# Patient Record
Sex: Male | Born: 1990 | Race: White | Hispanic: No | Marital: Married | Smoking: Never smoker
Health system: Southern US, Community
[De-identification: ages and names within clinical notes are randomized; demographics above are authoritative.]

## PROBLEM LIST (undated history)

## (undated) DIAGNOSIS — Z973 Presence of spectacles and contact lenses: Secondary | ICD-10-CM

## (undated) DIAGNOSIS — M6289 Other specified disorders of muscle: Secondary | ICD-10-CM

## (undated) HISTORY — PX: VASECTOMY: SHX75

## (undated) HISTORY — DX: Other specified disorders of muscle: M62.89

## (undated) HISTORY — DX: Presence of spectacles and contact lenses: Z97.3

---

## 2015-03-22 HISTORY — PX: VASECTOMY: SHX75

## 2016-03-06 ENCOUNTER — Encounter (HOSPITAL_COMMUNITY): Payer: Self-pay

## 2016-03-06 ENCOUNTER — Emergency Department (HOSPITAL_COMMUNITY)
Admission: EM | Admit: 2016-03-06 | Discharge: 2016-03-06 | Disposition: A | Discharge status: Home / Self Care | Payer: Managed Care, Other (non HMO) | Attending: Emergency Medicine | Admitting: Emergency Medicine

## 2016-03-06 ENCOUNTER — Emergency Department (HOSPITAL_COMMUNITY): Payer: Managed Care, Other (non HMO)

## 2016-03-06 DIAGNOSIS — J189 Pneumonia, unspecified organism: Secondary | ICD-10-CM | POA: Insufficient documentation

## 2016-03-06 DIAGNOSIS — R05 Cough: Secondary | ICD-10-CM | POA: Diagnosis present

## 2016-03-06 DIAGNOSIS — J9801 Acute bronchospasm: Secondary | ICD-10-CM | POA: Diagnosis not present

## 2016-03-06 MED ORDER — PROAIR HFA 108 (90 BASE) MCG/ACT IN AERS: 2.0000 | INHALATION_SPRAY | Freq: Three times a day (TID) | RESPIRATORY_TRACT | 0 refills | Status: DC | PRN

## 2016-03-06 MED ORDER — PREDNISONE 10 MG (21) PO TBPK: 10.0000 mg | ORAL_TABLET | Freq: Every day | ORAL | 0 refills | Status: DC

## 2016-03-06 MED ORDER — IPRATROPIUM BROMIDE 0.02 % IN SOLN
0.5000 mg | Freq: Once | RESPIRATORY_TRACT | Status: AC
  Administered 2016-03-06: 0.5 mg via RESPIRATORY_TRACT
  Filled 2016-03-06: qty 2.5

## 2016-03-06 MED ORDER — ALBUTEROL SULFATE (2.5 MG/3ML) 0.083% IN NEBU
5.0000 mg | INHALATION_SOLUTION | Freq: Once | RESPIRATORY_TRACT | Status: AC
  Administered 2016-03-06: 5 mg via RESPIRATORY_TRACT
  Filled 2016-03-06: qty 6

## 2016-03-06 MED ORDER — IPRATROPIUM-ALBUTEROL 0.5-2.5 (3) MG/3ML IN SOLN
3.0000 mL | Freq: Once | RESPIRATORY_TRACT | Status: AC
  Administered 2016-03-06: 3 mL via RESPIRATORY_TRACT
  Filled 2016-03-06: qty 3

## 2016-03-06 MED ORDER — PREDNISONE 20 MG PO TABS
60.0000 mg | ORAL_TABLET | Freq: Once | ORAL | Status: AC
  Administered 2016-03-06: 60 mg via ORAL
  Filled 2016-03-06: qty 3

## 2016-03-06 MED ORDER — AZITHROMYCIN 250 MG PO TABS: 250.0000 mg | ORAL_TABLET | Freq: Every day | ORAL | 0 refills | Status: DC

## 2016-03-06 MED ORDER — AZITHROMYCIN 250 MG PO TABS
500.0000 mg | ORAL_TABLET | Freq: Once | ORAL | Status: AC
  Administered 2016-03-06: 500 mg via ORAL
  Filled 2016-03-06: qty 2

## 2016-03-06 MED ORDER — HYDROCOD POLST-CPM POLST ER 10-8 MG/5ML PO SUER: 5.0000 mL | Freq: Every evening | ORAL | 0 refills | Status: DC | PRN

## 2016-03-06 NOTE — Discharge Instructions (Signed)
Read the information below.  Use the prescribed medication as directed.  Please discuss all new medications with your pharmacist.  You may return to the Emergency Department at any time for worsening condition or any new symptoms that concern you.   If you develop worsening shortness of breath, uncontrolled wheezing, severe chest pain, or fevers despite using tylenol and/or ibuprofen, return for a recheck.     °

## 2016-03-06 NOTE — ED Notes (Signed)
Patient transported to X-ray 

## 2016-03-06 NOTE — ED Notes (Signed)
Respiratory notified of nebulizations. 

## 2016-03-06 NOTE — ED Provider Notes (Signed)
[facility]-EMERGENCY DEPT Provider Note   CSN: [id number] Arrival date & time: 03/06/16  1039     History   Chief Complaint Chief Complaint  Patient presents with  . Cough    HPI Nicolas Doyle is a 25 y.o. male.  HPI   Patient presents with cough, wheezing, fever that began 4 days ago.  Fever to 102.7 last night.  Has developed mild sore throat, headache, and chest soreness from coughing.  Associated SOB.  Initially was coughing up brown sputum, then transitioned to clear.  Has been using inhaler and cough syrup given to him 3 days ago by PCP without improvement.  Also taking aleve for associated pain.  Denies leg swelling, recent immobilization, personal or family hx blood clots.  Pt notes he is very nervous because he hasn't been to a hospital since he was a baby.  Was sent in by [facility] because they do not have xrays at their facility.    History reviewed. No pertinent past medical history.  There are no active problems to display for this patient.   Past Surgical History:  Procedure Laterality Date  . VASECTOMY         Home Medications    Prior to Admission medications   Medication Sig Start Date End Date Taking? Authorizing Provider  brompheniramine-pseudoephedrine-DM 30-2-10 MG/5ML syrup Take 5 mLs by mouth 3 (three) times daily as needed for cough. 03/02/16  Yes Historical Provider, MD  naproxen sodium (ANAPROX) 220 MG tablet Take 220-440 mg by mouth 2 (two) times daily with a meal.   Yes Historical Provider, MD  oseltamivir (TAMIFLU) 75 MG capsule Take 75 mg by mouth 2 (two) times daily. 03/02/16  Yes Historical Provider, MD  azithromycin (ZITHROMAX) 250 MG tablet Take 1 tablet (250 mg total) by mouth daily. Start 03/07/16 03/06/16   Trixie Dredge, PA-C  chlorpheniramine-HYDROcodone (TUSSIONEX PENNKINETIC ER) 10-8 MG/5ML SUER Take 5 mLs by mouth at bedtime as needed for cough. 03/06/16   Trixie Dredge, PA-C  predniSONE (STERAPRED UNI-PAK 21 TAB) 10 MG (21) TBPK  tablet Take 1 tablet (10 mg total) by mouth daily. Day 1: take 6 tabs.  Day 2: 5 tabs  Day 3: 4 tabs  Day 4: 3 tabs  Day 5: 2 tabs  Day 6: 1 tab 03/06/16   Trixie Dredge, PA-C  PROAIR HFA 108 [phone] Base) MCG/ACT inhaler Inhale 2 puffs into the lungs 3 (three) times daily as needed for shortness of breath. 03/06/16   Trixie Dredge, PA-C    Family History No family history on file.  Social History Social History  Substance Use Topics  . Smoking status: Never Smoker  . Smokeless tobacco: Never Used  . Alcohol use Yes     Comment: rare     Allergies   Patient has no known allergies.   Review of Systems Review of Systems  All other systems reviewed and are negative.    Physical Exam Updated Vital Signs BP 135/80 (BP Location: Right Arm)   Pulse 100   Temp 99.2 F (37.3 C) (Oral)   Resp 18   Ht 5\' 8"  (1.727 m)   Wt 112.9 kg   SpO2 96%   BMI 37.86 kg/m   Physical Exam  Constitutional: He appears well-developed and well-nourished. No distress.  HENT:  Head: Normocephalic and atraumatic.  Mouth/Throat: Oropharynx is clear and moist. No oropharyngeal exudate.  Eyes: Conjunctivae and EOM are normal. Right eye exhibits no discharge. Left eye exhibits no discharge.  Neck: Normal  range of motion. Neck supple.  Cardiovascular: Normal rate and regular rhythm.   Pulmonary/Chest: Effort normal. No stridor. No respiratory distress. He has wheezes. He has no rales.  Abdominal: Soft. He exhibits no distension and no mass. There is no tenderness. There is no guarding.  Musculoskeletal: He exhibits no edema or tenderness.  Lymphadenopathy:    He has no cervical adenopathy.  Neurological: He is alert.  Skin: He is not diaphoretic.  Psychiatric: His mood appears anxious.  Nursing note and vitals reviewed.    ED Treatments / Results  Labs (all labs ordered are listed, but only abnormal results are displayed) Labs Reviewed - No data to display  EKG  EKG Interpretation None        Radiology Dg Chest 2 View  Result Date: 03/06/2016 CLINICAL DATA:  Productive cough. EXAM: CHEST  2 VIEW COMPARISON:  None. FINDINGS: Multifocal pneumonia involving the left upper lobe and bilateral lower lobes. No other acute abnormalities. IMPRESSION: Multifocal pneumonia.  Recommend follow-up to resolution. Electronically Signed   By: Gerome Samavid  Williams III M.D   On: 03/06/2016 11:49    Procedures Procedures (including critical care time)  Medications Ordered in ED Medications  ipratropium-albuterol (DUONEB) 0.5-2.5 (3) MG/3ML nebulizer solution 3 mL (3 mLs Nebulization Given 03/06/16 1125)  predniSONE (DELTASONE) tablet 60 mg (60 mg Oral Given 03/06/16 1118)  azithromycin (ZITHROMAX) tablet 500 mg (500 mg Oral Given 03/06/16 1210)  albuterol (PROVENTIL) (2.5 MG/3ML) 0.083% nebulizer solution 5 mg (5 mg Nebulization Given 03/06/16 1237)  ipratropium (ATROVENT) nebulizer solution 0.5 mg (0.5 mg Nebulization Given 03/06/16 1237)     Initial Impression / Assessment and Plan / ED Course  I have reviewed the triage vital signs and the nursing notes.  Pertinent labs & imaging results that were available during my care of the patient were reviewed by me and considered in my medical decision making (see chart for details).  Clinical Course as of Mar 07 1839  Wynelle LinkSun Mar 06, 2016  1227 Persistent loud wheezing throughout  [EW]  1429 Patient feeling much better.  Lungs overall very clear, few rales, occasional slight wheeze.  [EW]    Clinical Course User Index [EW] Trixie DredgeEmily Joyce Heitman, PA-C    Patient with 5 days of fever, cough, wheezing, SOB found to have multifocal pneumonia.  Diffuse wheezing on exam.  Nebs, prednisone, azithromycin given with great improvement.  D/C home with azithromycin, tussionex, prednisone.  Discussed return precautions. Discussed result, findings, treatment, and follow up  with patient.  Pt given return precautions.  Pt verbalizes understanding and agrees with plan.        Final Clinical Impressions(s) / ED Diagnoses   Final diagnoses:  Community acquired pneumonia, unspecified laterality  Bronchospasm    New Prescriptions Discharge Medication List as of 03/06/2016  2:35 PM    START taking these medications   Details  azithromycin (ZITHROMAX) 250 MG tablet Take 1 tablet (250 mg total) by mouth daily. Start 03/07/16, Starting Sun 03/06/2016, Print    chlorpheniramine-HYDROcodone (TUSSIONEX PENNKINETIC ER) 10-8 MG/5ML SUER Take 5 mLs by mouth at bedtime as needed for cough., Starting Sun 03/06/2016, Print    predniSONE (STERAPRED UNI-PAK 21 TAB) 10 MG (21) TBPK tablet Take 1 tablet (10 mg total) by mouth daily. Day 1: take 6 tabs.  Day 2: 5 tabs  Day 3: 4 tabs  Day 4: 3 tabs  Day 5: 2 tabs  Day 6: 1 tab, Starting Sun 03/06/2016, Print  Trixie Dredgemily Sharone Picchi, PA-C 03/06/16 1841    Shaune Pollackameron Isaacs, MD 03/07/16 [id number]

## 2016-03-06 NOTE — ED Triage Notes (Signed)
Pt presents with c/o productive cough and fever. Pt was sent her by [facility] for a chest x-ray. Pt reports a max temp at home 102.8, temp in triage is 99.1 with a HR of 117.

## 2016-03-06 NOTE — ED Notes (Signed)
Respiratory notified of nebulization. 

## 2016-03-06 NOTE — ED Notes (Signed)
ED Provider at bedside. 

## 2016-03-06 NOTE — ED Notes (Signed)
Patient given water per EDP request.

## 2016-03-06 NOTE — ED Notes (Signed)
Discharge instructions, follow up care, and rx x4 reviewed with patient. Patient verbalized understanding. 

## 2019-09-11 ENCOUNTER — Telehealth: Payer: Self-pay

## 2019-09-11 NOTE — Telephone Encounter (Signed)
Pt called back immediately and is scheduled for new pt appt on 10/09/19 @ 1 pm.

## 2019-09-11 NOTE — Telephone Encounter (Signed)
LMOVM for pt to call me back  

## 2019-10-08 DIAGNOSIS — M6289 Other specified disorders of muscle: Secondary | ICD-10-CM | POA: Insufficient documentation

## 2019-10-09 ENCOUNTER — Ambulatory Visit (HOSPITAL_BASED_OUTPATIENT_CLINIC_OR_DEPARTMENT_OTHER)
Admission: RE | Admit: 2019-10-09 | Discharge: 2019-10-09 | Disposition: A | Discharge status: Home / Self Care | Payer: 59 | Source: Ambulatory Visit | Attending: Internal Medicine | Admitting: Internal Medicine

## 2019-10-09 ENCOUNTER — Other Ambulatory Visit: Payer: Self-pay

## 2019-10-09 ENCOUNTER — Ambulatory Visit: Payer: Managed Care, Other (non HMO) | Admitting: Internal Medicine

## 2019-10-09 ENCOUNTER — Encounter: Payer: Self-pay | Admitting: Internal Medicine

## 2019-10-09 VITALS — BP 119/79 | HR 78 | Temp 98.7°F | Resp 18 | Ht 68.0 in | Wt 236.2 lb

## 2019-10-09 DIAGNOSIS — Z23 Encounter for immunization: Secondary | ICD-10-CM | POA: Diagnosis not present

## 2019-10-09 DIAGNOSIS — Z0000 Encounter for general adult medical examination without abnormal findings: Secondary | ICD-10-CM

## 2019-10-09 DIAGNOSIS — M25531 Pain in right wrist: Secondary | ICD-10-CM | POA: Insufficient documentation

## 2019-10-09 DIAGNOSIS — Z114 Encounter for screening for human immunodeficiency virus [HIV]: Secondary | ICD-10-CM | POA: Diagnosis not present

## 2019-10-09 DIAGNOSIS — Z1159 Encounter for screening for other viral diseases: Secondary | ICD-10-CM | POA: Diagnosis not present

## 2019-10-09 LAB — COMPREHENSIVE METABOLIC PANEL
ALT: 18 U/L (ref 0–53)
AST: 13 U/L (ref 0–37)
Albumin: 4.6 g/dL (ref 3.5–5.2)
Alkaline Phosphatase: 80 U/L (ref 39–117)
BUN: 9 mg/dL (ref 6–23)
CO2: 34 mEq/L — ABNORMAL HIGH (ref 19–32)
Calcium: 9.9 mg/dL (ref 8.4–10.5)
Chloride: 100 mEq/L (ref 96–112)
Creatinine, Ser: 0.73 mg/dL (ref 0.40–1.50)
GFR: 125.25 mL/min (ref >60.00)
Glucose, Bld: 90 mg/dL (ref 70–99)
Potassium: 4.5 mEq/L (ref 3.5–5.1)
Sodium: 140 mEq/L (ref 135–145)
Total Bilirubin: 0.4 mg/dL (ref 0.2–1.2)
Total Protein: 7.2 g/dL (ref 6.0–8.3)

## 2019-10-09 LAB — CBC WITH DIFFERENTIAL/PLATELET
Basophils Absolute: 0.1 10*3/uL (ref 0.0–0.1)
Basophils Relative: 1 % (ref 0.0–3.0)
Eosinophils Absolute: 0.1 10*3/uL (ref 0.0–0.7)
Eosinophils Relative: 2.1 % (ref 0.0–5.0)
HCT: 42.7 % (ref 39.0–52.0)
Hemoglobin: 14.6 g/dL (ref 13.0–17.0)
Lymphocytes Relative: 25 % (ref 12.0–46.0)
Lymphs Abs: 1.7 10*3/uL (ref 0.7–4.0)
MCHC: 34.3 g/dL (ref 30.0–36.0)
MCV: 89.9 fl (ref 78.0–100.0)
Monocytes Absolute: 0.5 10*3/uL (ref 0.1–1.0)
Monocytes Relative: 7.6 % (ref 3.0–12.0)
Neutro Abs: 4.3 10*3/uL (ref 1.4–7.7)
Neutrophils Relative %: 64.3 % (ref 43.0–77.0)
Platelets: 303 10*3/uL (ref 150.0–400.0)
RBC: 4.75 Mil/uL (ref 4.22–5.81)
RDW: 13.2 % (ref 11.5–15.5)
WBC: 6.8 10*3/uL (ref 4.0–10.5)

## 2019-10-09 LAB — LIPID PANEL
Cholesterol: 179 mg/dL (ref 0–200)
HDL: 51.3 mg/dL (ref >39.00)
LDL Cholesterol: 98 mg/dL (ref 0–99)
NonHDL: 127.9
Total CHOL/HDL Ratio: 3
Triglycerides: 151 mg/dL — ABNORMAL HIGH (ref 0.0–149.0)
VLDL: 30.2 mg/dL (ref 0.0–40.0)

## 2019-10-09 LAB — TSH: TSH: 1.4 u[IU]/mL (ref 0.35–4.50)

## 2019-10-09 NOTE — Progress Notes (Signed)
Subjective:    Patient ID: Nicolas Doyle, male    DOB: Jul 06, 1990, 29 y.o.   MRN: [id number]  DOS:  10/09/2019 Type of visit - description: CPX New patient, here to get established. Would like STD testing. Reported that he was ripping apart cardboard few weeks ago and in the process hurt his right wrist. Has diffuse wrist pain whenever he turns his hand -- like when he opens a doorknob. No swelling. Pain is already decreasing  Review of Systems Denies dysuria, gross hematuria, penile discharge.  No high risk sexual behaviors.   Other than above, a 14 point review of systems is negative      Past Medical History:  Diagnosis Date  . Pelvic floor dysfunction   . Wears glasses     Past Surgical History:  Procedure Laterality Date  . VASECTOMY  2017   Family History  Problem Relation Age of Onset  . Diabetes Maternal Grandfather   . Diabetes Paternal Grandmother   . Colon cancer Neg Hx   . Prostate cancer Neg Hx    Social History   Socioeconomic History  . Marital status: Married    Spouse name: Not on file  . Number of children: 0  . Years of education: Not on file  . Highest education level: Not on file  Occupational History  . Occupation: finished college  . Occupation: Sport and exercise psychologist   Tobacco Use  . Smoking status: Never Smoker  . Smokeless tobacco: Never Used  Substance and Sexual Activity  . Alcohol use: Yes    Comment: social  . Drug use: No  . Sexual activity: Not on file  Other Topics Concern  . Not on file  Social History Narrative  . Not on file   Social Determinants of Health   Financial Resource Strain:   . Difficulty of Paying Living Expenses:   Food Insecurity:   . Worried About Programme researcher, broadcasting/film/video in the Last Year:   . Barista in the Last Year:   Transportation Needs:   . Freight forwarder (Medical):   Marland Kitchen Lack of Transportation (Non-Medical):   Physical Activity:   . Days of Exercise per Week:   . Minutes  of Exercise per Session:   Stress:   . Feeling of Stress :   Social Connections:   . Frequency of Communication with Friends and Family:   . Frequency of Social Gatherings with Friends and Family:   . Attends Religious Services:   . Active Member of Clubs or Organizations:   . Attends Banker Meetings:   Marland Kitchen Marital Status:   Intimate Partner Violence:   . Fear of Current or Ex-Partner:   . Emotionally Abused:   Marland Kitchen Physically Abused:   . Sexually Abused:      Allergies as of 10/09/2019   No Known Allergies     Medication List       Accurate as of October 09, 2019 11:59 PM. If you have any questions, ask your nurse or doctor.        STOP taking these medications   azithromycin 250 MG tablet Commonly known as: ZITHROMAX Stopped by: Willow Ora, MD   brompheniramine-pseudoephedrine-DM 30-2-10 MG/5ML syrup Stopped by: Willow Ora, MD   chlorpheniramine-HYDROcodone 10-8 MG/5ML Suer Commonly known as: Tussionex Pennkinetic ER Stopped by: Willow Ora, MD   naproxen sodium 220 MG tablet Commonly known as: ALEVE Stopped by: Willow Ora, MD   oseltamivir 75 MG capsule Commonly known  as: TAMIFLU Stopped by: Willow Ora, MD   predniSONE 10 MG (21) Tbpk tablet Commonly known as: STERAPRED UNI-PAK 21 TAB Stopped by: Willow Ora, MD   ProAir HFA [phone] Base) MCG/ACT inhaler Generic drug: albuterol Stopped by: Willow Ora, MD     TAKE these medications   multivitamin with minerals tablet Take 1 tablet by mouth daily.          Objective:   Physical Exam BP 119/79 (BP Location: Left Arm, Patient Position: Sitting, Cuff Size: Normal)   Pulse 78   Temp 98.7 F (37.1 C) (Oral)   Resp 18   Ht 5\' 8"  (1.727 m)   Wt 236 lb 4 oz (107.2 kg)   SpO2 98%   BMI 35.92 kg/m  General: Well developed, NAD, BMI noted Neck: No  thyromegaly  HEENT:  Normocephalic . Face symmetric, atraumatic Lungs:  CTA B Normal respiratory effort, no intercostal retractions, no accessory muscle  use. Heart: RRR,  no murmur.  Abdomen:  Not distended, soft, non-tender. No rebound or rigidity.   Lower extremities: no pretibial edema bilaterally  Skin: Exposed areas without rash. Not pale. Not jaundice MSK: Left wrist normal Right wrist: No deformities, swelling, redness. Passive range of motion normal Active range of motion: Report some pain at the radial side Neurologic:  alert & oriented X3.  Speech normal, gait appropriate for age and unassisted Strength symmetric and appropriate for age.  Psych: Cognition and judgment appear intact.  Cooperative with normal attention span and concentration.  Behavior appropriate. No anxious or depressed appearing.     Assessment     ASSESSMENT (new patient 09-2019) Anxiety; sees a counselor  Vasectomy 2017   PLAN New patient Wrist pain: As described above, will get x-ray, pain is already improving, if not completely willing to 3 weeks he will call for Ortho referral. RTC 1 year  This visit occurred during the SARS-CoV-2 public health emergency.  Safety protocols were in place, including screening questions prior to the visit, additional usage of staff PPE, and extensive cleaning of exam room while observing appropriate contact time as indicated for disinfecting solutions.

## 2019-10-09 NOTE — Progress Notes (Signed)
Pre visit review using our clinic review tool, if applicable. No additional management support is needed unless otherwise documented below in the visit note. 

## 2019-10-09 NOTE — Patient Instructions (Addendum)
It was great meeting you today!   Okay to use your wrist brace  Call if the wrist is not better in the next 2 to 3 weeks  Please check with your insurance to be sure they cover the HPV immunizations.   GO TO THE LAB : Get the blood work     GO TO THE FRONT DESK, PLEASE SCHEDULE YOUR APPOINTMENTS Come back for a physical   exam in 1 year  STOP BY THE FIRST FLOOR:  get the XR      Testicular Self-Exam A self-examination of your testicles (testicular self-exam) involves looking at and feeling your testicles for abnormal lumps or swelling. Several things can cause swelling, lumps, or pain in your testicles. Some of these causes are:  Injuries.  Inflammation.  Infection.  Buildup of fluids around your testicle (hydrocele).  Twisted testicles (testicular torsion).  Testicular cancer. Why is it important to do a testicular self-exam? Self-examination of the testicles and the left and right groin areas may be recommended if you are at risk for testicular cancer. Your groin is where your lower abdomen meets your upper thighs. You may be at risk for testicular cancer if you have:  An undescended testicle (cryptorchidism).  A history of previous testicular cancer.  A family history of testicular cancer. How to do a testicular self-exam The testicles are easiest to examine after a warm bath or shower. They are more difficult to examine when you are cold. This is because the muscles attached to the testicles retract and pull them up higher or into the abdomen. A normal testicle is egg-shaped and feels firm. It is smooth and not tender. The spermatic cord can be felt as a firm, spaghetti-like cord at the back of your testicle. Look and feel for changes  Stand and hold your penis away from your body.  Look at each testicle to check for lumps or swelling.  Roll each testicle between your thumb and forefinger, feeling the entire testicle. Feel  for: ? Lumps. ? Swelling. ? Discomfort.  Check the groin area between your abdomen and upper thighs on both sides of your body. Look and feel for any swelling or bumps that are tender. These could be enlarged lymph nodes. Contact a health care provider if:  You find any bumps or lumps, such as a small, hard, pea-sized lump.  You find swelling, pain, or soreness.  You see or feel any other changes in your testicles. Summary  A self-examination of your testicles (testicular self-exam) involves looking at and feeling your testicles for any changes.  Self-examination of the testicles and the left and right groin areas may be recommended if you are at risk for testicular cancer.  You should check each of your testicles for lumps, swelling, or discomfort.  You should check for swelling or tender bumps in your groin area between your lower abdomen and upper thighs. This information is not intended to replace advice given to you by your health care provider. Make sure you discuss any questions you have with your health care provider. Document Revised: 06/28/2018 Document Reviewed: 02/01/2016 Elsevier Patient Education  2020 [employer].

## 2019-10-10 ENCOUNTER — Encounter: Payer: Self-pay | Admitting: Internal Medicine

## 2019-10-10 DIAGNOSIS — Z0000 Encounter for general adult medical examination without abnormal findings: Secondary | ICD-10-CM | POA: Insufficient documentation

## 2019-10-10 LAB — HIV ANTIBODY (ROUTINE TESTING W REFLEX): HIV 1&2 Ab, 4th Generation: NONREACTIVE

## 2019-10-10 LAB — RPR: RPR Ser Ql: NONREACTIVE

## 2019-10-10 LAB — HEPATITIS C ANTIBODY
Hepatitis C Ab: NONREACTIVE
SIGNAL TO CUT-OFF: 0 (ref <1.00)

## 2019-10-10 NOTE — Assessment & Plan Note (Signed)
New patient, CPX Tdap today Had Covid vaccinations Recommend flu shot yearly Request HPV, recommend to discuss with insurance for coverage Labs: CMP, FLP, CBC, TSH, HIV, RPR, hep C Patient education on self testicular exam, diet and exercise.

## 2019-12-13 ENCOUNTER — Telehealth: Payer: Self-pay | Admitting: Internal Medicine

## 2019-12-13 NOTE — Telephone Encounter (Signed)
Pt dropped off document to be filled out by provider (1 page Wellness Screen Form) pt would like to be called when document ready at [phone]. Document put at front office tray under providers name.

## 2019-12-16 NOTE — Telephone Encounter (Signed)
Spoke w/ Pt- informed form ready for pick up.

## 2019-12-16 NOTE — Telephone Encounter (Signed)
Form completed, tried calling Pt to inform that form is ready for pick up, someone answered but we could not hear each other. I will try again later. Form placed at front desk. Copy sent for scanning.

## 2020-03-16 ENCOUNTER — Encounter: Payer: Self-pay | Admitting: Internal Medicine

## 2020-10-09 ENCOUNTER — Ambulatory Visit (INDEPENDENT_AMBULATORY_CARE_PROVIDER_SITE_OTHER): Payer: PRIVATE HEALTH INSURANCE | Admitting: Internal Medicine

## 2020-10-09 ENCOUNTER — Encounter: Payer: Self-pay | Admitting: Internal Medicine

## 2020-10-09 ENCOUNTER — Other Ambulatory Visit: Payer: Self-pay

## 2020-10-09 VITALS — BP 128/80 | HR 100 | Temp 99.1°F | Resp 16 | Ht 68.0 in | Wt 252.1 lb

## 2020-10-09 DIAGNOSIS — Z0000 Encounter for general adult medical examination without abnormal findings: Secondary | ICD-10-CM | POA: Diagnosis not present

## 2020-10-09 NOTE — Progress Notes (Signed)
Subjective:    Patient ID: Nicolas Doyle, male    DOB: 1990/06/14, 30 y.o.   MRN: [id number]  DOS:  10/09/2020 Type of visit - description: CPX  Here for CPX, since the last office visit is doing well except for anxiety. He is anxious about getting long COVID.   Review of Systems  Other than above, a 14 point review of systems is negative       Past Medical History:  Diagnosis Date   Pelvic floor dysfunction    Wears glasses     Past Surgical History:  Procedure Laterality Date   VASECTOMY  2017    Social History   Socioeconomic History   Marital status: Married    Spouse name: Not on file   Number of children: 0   Years of education: Not on file   Highest education level: Not on file  Occupational History   Occupation: finished college   Occupation: Sport and exercise psychologist   Tobacco Use   Smoking status: Never   Smokeless tobacco: Never  Substance and Sexual Activity   Alcohol use: Yes    Comment: rarrely   Drug use: No   Sexual activity: Not on file  Other Topics Concern   Not on file  Social History Narrative   Not on file   Social Determinants of Health   Financial Resource Strain: Not on file  Food Insecurity: Not on file  Transportation Needs: Not on file  Physical Activity: Not on file  Stress: Not on file  Social Connections: Not on file  Intimate Partner Violence: Not on file     Allergies as of 10/09/2020   No Known Allergies      Medication List        Accurate as of October 09, 2020 11:59 PM. If you have any questions, ask your nurse or doctor.          multivitamin with minerals tablet Take 1 tablet by mouth daily.           Objective:   Physical Exam BP 128/80 (BP Location: Left Arm, Patient Position: Sitting, Cuff Size: Normal)   Pulse 100   Temp 99.1 F (37.3 C) (Oral)   Resp 16   Ht 5\' 8"  (1.727 m)   Wt 252 lb 2 oz (114.4 kg)   SpO2 98%   BMI 38.34 kg/m  General: Well developed, NAD, BMI  noted Neck: No  thyromegaly  HEENT:  Normocephalic . Face symmetric, atraumatic Lungs:  CTA B Normal respiratory effort, no intercostal retractions, no accessory muscle use. Heart: RRR,  no murmur.  Abdomen:  Not distended, soft, non-tender. No rebound or rigidity.   Lower extremities: no pretibial edema bilaterally  Skin: Exposed areas without rash. Not pale. Not jaundice Neurologic:  alert & oriented X3.  Speech normal, gait appropriate for age and unassisted Strength symmetric and appropriate for age.  Psych: Cognition and judgment appear intact.  Cooperative with normal attention span and concentration.  Behavior appropriate. No anxious or depressed appearing.     Assessment     ASSESSMENT (new patient 09-2019) Anxiety; sees a counselor  Vasectomy 2017   PLAN Here for CPX Anxiety: Patient has been stressed out about the possibility of getting long COVID, we had a long conversation about it, strategy for risk reduction includes using a mask,   immunizations, if he ever is diagnosed w/ covid prompt  treatment with oral medications. RTC 1 year   This visit occurred during the SARS-CoV-2  public health emergency.  Safety protocols were in place, including screening questions prior to the visit, additional usage of staff PPE, and extensive cleaning of exam room while observing appropriate contact time as indicated for disinfecting solutions.

## 2020-10-09 NOTE — Patient Instructions (Signed)
  Please call your insurance company about the HPV vaccine coverage.     If you like to proceed simply call get a nurse appointment   GO TO THE FRONT DESK, PLEASE SCHEDULE YOUR APPOINTMENTS Come back for   fasting blood work next week  Come back for a physical exam in 1 year

## 2020-10-11 ENCOUNTER — Encounter: Payer: Self-pay | Admitting: Internal Medicine

## 2020-10-11 NOTE — Assessment & Plan Note (Signed)
Tdap: 2021 COVID VAX x3 Recommend flu shot yearly HPV, recommend to discuss with insurance for coverage Labs: FLP, BMP. Patient education on self testicular exam (which he does and is normal) Lifestyle: Recently started exercise program with virtual personal trainer, he and his wife are trying to eat healthier. Praised

## 2020-10-14 ENCOUNTER — Other Ambulatory Visit (INDEPENDENT_AMBULATORY_CARE_PROVIDER_SITE_OTHER): Payer: PRIVATE HEALTH INSURANCE

## 2020-10-14 ENCOUNTER — Other Ambulatory Visit: Payer: Self-pay

## 2020-10-14 DIAGNOSIS — Z0000 Encounter for general adult medical examination without abnormal findings: Secondary | ICD-10-CM | POA: Diagnosis not present

## 2020-10-14 LAB — BASIC METABOLIC PANEL
BUN: 10 mg/dL (ref 6–23)
CO2: 31 mEq/L (ref 19–32)
Calcium: 9.6 mg/dL (ref 8.4–10.5)
Chloride: 100 mEq/L (ref 96–112)
Creatinine, Ser: 0.71 mg/dL (ref 0.40–1.50)
GFR: 121.75 mL/min (ref >60.00)
Glucose, Bld: 78 mg/dL (ref 70–99)
Potassium: 4.4 mEq/L (ref 3.5–5.1)
Sodium: 140 mEq/L (ref 135–145)

## 2020-10-14 LAB — LIPID PANEL
Cholesterol: 163 mg/dL (ref 0–200)
HDL: 43.2 mg/dL (ref >39.00)
LDL Cholesterol: 96 mg/dL (ref 0–99)
NonHDL: 119.74
Total CHOL/HDL Ratio: 4
Triglycerides: 121 mg/dL (ref 0.0–149.0)
VLDL: 24.2 mg/dL (ref 0.0–40.0)

## 2020-10-15 ENCOUNTER — Telehealth: Payer: Self-pay

## 2020-10-15 NOTE — Telephone Encounter (Signed)
Spoke w/ Pt- informed form ready for pick up at front desk. Copy sent for scanning.  ?

## 2020-11-25 ENCOUNTER — Encounter: Payer: Self-pay | Admitting: Internal Medicine

## 2020-11-25 ENCOUNTER — Ambulatory Visit: Payer: PRIVATE HEALTH INSURANCE | Admitting: Internal Medicine

## 2020-11-25 ENCOUNTER — Other Ambulatory Visit: Payer: Self-pay

## 2020-11-25 VITALS — BP 122/80 | HR 89 | Temp 99.2°F | Resp 16 | Ht 68.0 in | Wt 246.2 lb

## 2020-11-25 DIAGNOSIS — M25512 Pain in left shoulder: Secondary | ICD-10-CM | POA: Diagnosis not present

## 2020-11-25 MED ORDER — MELOXICAM 7.5 MG PO TABS: 7.5000 mg | ORAL_TABLET | Freq: Every day | ORAL | 0 refills | Status: DC | PRN

## 2020-11-25 NOTE — Progress Notes (Signed)
   Subjective:    Patient ID: Nicolas Doyle, male    DOB: July 07, 1990, 30 y.o.   MRN: [id number]  DOS:  11/25/2020 Type of visit - description: Acute  Main concern is left shoulder pain. The patient work with a Systems analyst, few weeks ago he developed pain at the anterior shoulder during training, he stopped doing upper body exercises and then went back. He again developed the pain.  Denies pain at rest, pain  trigger  is certain  positions. No neck pain, no upper extremities weaknesses or paresthesias.   Review of Systems See above   Past Medical History:  Diagnosis Date   Pelvic floor dysfunction    Wears glasses     Past Surgical History:  Procedure Laterality Date   VASECTOMY  2017    Allergies as of 11/25/2020   No Known Allergies      Medication List        Accurate as of November 25, 2020  3:55 PM. If you have any questions, ask your nurse or doctor.          multivitamin with minerals tablet Take 1 tablet by mouth daily.           Objective:   Physical Exam BP 122/80 (BP Location: Left Arm, Patient Position: Sitting, Cuff Size: Normal)   Pulse 89   Temp 99.2 F (37.3 C) (Oral)   Resp 16   Ht 5\' 8"  (1.727 m)   Wt 246 lb 4 oz (111.7 kg)   SpO2 97%   BMI 37.44 kg/m  General:   Well developed, NAD, BMI noted. HEENT:  Normocephalic . Face symmetric, atraumatic Skin: Shoulders are symmetric, minimal TTP at the anterior aspect of the left shoulder. Range of motion and strength is normal. Minimal pain elicited by abduction and extension. Lower extremities: no pretibial edema bilaterally  Skin: Not pale. Not jaundice Neurologic:  alert & oriented X3.  Speech normal, gait appropriate for age and unassisted Psych--  Cognition and judgment appear intact.  Cooperative with normal attention span and concentration.  Behavior appropriate. No anxious or depressed appearing.      Assessment    ASSESSMENT (new patient  09-2019) Anxiety; sees a counselor  Vasectomy 2017   PLAN L shoulder pain: Possible tendinitis of the rotator cuff. Recommend rest, meloxicam for 10 days with GI precautions, then meloxicam as needed. Restart upper extremity exercises in 10 to 14 days.  Call if symptoms persist.  Sports medicine?   This visit occurred during the SARS-CoV-2 public health emergency.  Safety protocols were in place, including screening questions prior to the visit, additional usage of staff PPE, and extensive cleaning of exam room while observing appropriate contact time as indicated for disinfecting solutions.

## 2020-11-25 NOTE — Patient Instructions (Signed)
Take meloxicam 7.5 mg 1 tablet daily for 10 days, then only as needed.  Always take it with food because may cause gastritis and ulcers.  If you notice nausea, stomach pain, change in the color of stools --->  Stop the medicine and let us know  Restart your upper extremities exercise in 10 days.  If not better or the pain resurface let me know.

## 2020-11-30 ENCOUNTER — Encounter: Payer: Self-pay | Admitting: Internal Medicine

## 2021-10-15 ENCOUNTER — Encounter: Payer: Self-pay | Admitting: Internal Medicine

## 2021-10-15 ENCOUNTER — Ambulatory Visit (INDEPENDENT_AMBULATORY_CARE_PROVIDER_SITE_OTHER): Payer: PRIVATE HEALTH INSURANCE | Admitting: Internal Medicine

## 2021-10-15 VITALS — BP 126/86 | HR 109 | Temp 98.7°F | Resp 18 | Ht 68.0 in | Wt 253.5 lb

## 2021-10-15 DIAGNOSIS — F411 Generalized anxiety disorder: Secondary | ICD-10-CM | POA: Diagnosis not present

## 2021-10-15 DIAGNOSIS — Z0000 Encounter for general adult medical examination without abnormal findings: Secondary | ICD-10-CM | POA: Diagnosis not present

## 2021-10-15 NOTE — Progress Notes (Unsigned)
   Subjective:    Patient ID: Nicolas Doyle, male    DOB: 10/17/1990, 31 y.o.   MRN: [id number]  DOS:  10/15/2021 Type of visit - description: CPX  Here for CPX. We had a long conversation about anxiety and panic attacks. Panic attacks are characterized by feeling rapid and strong heartbeats, some dizziness, like a "knot" in the chest. His mind races Symptoms typically are better after he distracting self. No exertional symptoms. Panic attacks were really prevalent earlier this year, he is better now  Review of Systems See above   Past Medical History:  Diagnosis Date   Pelvic floor dysfunction    Wears glasses     Past Surgical History:  Procedure Laterality Date   VASECTOMY  2017    Current Outpatient Medications  Medication Instructions   meloxicam (MOBIC) 7.5 mg, Oral, Daily PRN   Multiple Vitamins-Minerals (MULTIVITAMIN WITH MINERALS) tablet 1 tablet, Daily       Objective:   Physical Exam BP 126/86   Pulse (!) 109   Temp 98.7 F (37.1 C) (Oral)   Resp 18   Ht 5\' 8"  (1.727 m)   Wt 253 lb 8 oz (115 kg)   SpO2 98%   BMI 38.54 kg/m  General: Well developed, NAD, BMI noted Neck: No  thyromegaly  HEENT:  Normocephalic . Face symmetric, atraumatic Lungs:  CTA B Normal respiratory effort, no intercostal retractions, no accessory muscle use. Heart: Slightly tachycardic.  Abdomen:  Not distended, soft, non-tender. No rebound or rigidity.   Lower extremities: no pretibial edema bilaterally  Skin: Exposed areas without rash. Not pale. Not jaundice Neurologic:  alert & oriented X3.  Speech normal, gait appropriate for age and unassisted Strength symmetric and appropriate for age.  Psych: Cognition and judgment appear intact.  Cooperative with normal attention span and concentration.  Behavior appropriate. He is anxious on and off during the visit.  When he took, panic attack he started sweating     Assessment    ASSESSMENT (new patient  09-2019) Generalized anxiety disorder sees a counselor  Vasectomy 2017   PLAN Here for CPX Generalized anxiety disorder: We had a long conversation about the issue.  He started having symptoms of anxiety when he was 30 years old, at some point they were severe.  Denies any suicidal or homicidal ideas. He has seen a counselor for a while.  Never tried medications. Current symptoms are anxiety in general panic attacks as described above. He also reports "hypochondriac" symptoms, thinks he has a number of conditions that will kill him. Extensive listening therapy provided, I explained him that he has GAD, the right treatment is counseling and medication which was offered to him. I also explained that feeling "hypochondriac" is in my opinion, part of the anxiety syndrome.  While I discouraged him to ignore his symptoms, I recommend him to consult me or or Dr. If he has any bothersome problems. He declines medication for now, will come back in 3 to 4 months for reassessment, will call sooner if he decides to take medications (SSRIs). RTC 3 to 4 months.  Tdap: 2021 COVID VAX: utd Recommend flu shot yearly   Labs: CMP, FLP, CBC, TSH. Lifestyle: The patient is returning to do better, he started healthier diet 2 weeks ago, he has a 2022 and does exercise at home every other day.

## 2021-10-15 NOTE — Patient Instructions (Addendum)
Recommend flu shot this fall.   Come back on Monday, fasting for blood work.  Next visit in 3 to 4 months, please make an appointment. Come back sooner if needed.

## 2021-10-17 ENCOUNTER — Encounter: Payer: Self-pay | Admitting: Internal Medicine

## 2021-10-17 DIAGNOSIS — Z09 Encounter for follow-up examination after completed treatment for conditions other than malignant neoplasm: Secondary | ICD-10-CM | POA: Insufficient documentation

## 2021-10-17 NOTE — Assessment & Plan Note (Signed)
Here for CPX Generalized anxiety disorder: We had a long conversation about the issue. , never formally dx w/ GAD. He started having symptoms of anxiety when he was 31 years old, at some point they were severe.  Denies any suicidal or homicidal ideas. He has seen a counselor for a while.  Never tried medications. Current symptoms are anxiety in general and panic attacks as described above. He also reports "hypochondriac" symptoms, thinks he has a number of conditions that will kill him. Extensive listening therapy provided, I explained him that he has GAD, the right treatment is counseling and medication which was offered to him. I also explained that feeling "hypochondriac" , likely a related GAd.  I discouraged him to ignore his symptoms, I recommend him to consult me or a doctor if he has a persistent or  bothersome problems. He declines medication for now, will come back in 3 to 4 months for reassessment, will call sooner if he decides to take medications (SSRIs). RTC 3 to 4 months.

## 2021-10-17 NOTE — Assessment & Plan Note (Signed)
Tdap: 2021 COVID VAX: utd Recommend flu shot yearly Labs: CMP, FLP, CBC, TSH. Lifestyle: The patient is determned to do better, he started healthier diet 2 weeks ago, he has a Systems analyst and does exercise at home every other day.

## 2021-10-18 ENCOUNTER — Other Ambulatory Visit (INDEPENDENT_AMBULATORY_CARE_PROVIDER_SITE_OTHER): Payer: PRIVATE HEALTH INSURANCE

## 2021-10-18 DIAGNOSIS — Z0000 Encounter for general adult medical examination without abnormal findings: Secondary | ICD-10-CM

## 2021-10-18 LAB — LIPID PANEL
Cholesterol: 180 mg/dL (ref 0–200)
HDL: 44 mg/dL (ref >39.00)
LDL Cholesterol: 107 mg/dL — ABNORMAL HIGH (ref 0–99)
NonHDL: 135.83
Total CHOL/HDL Ratio: 4
Triglycerides: 143 mg/dL (ref 0.0–149.0)
VLDL: 28.6 mg/dL (ref 0.0–40.0)

## 2021-10-18 LAB — COMPREHENSIVE METABOLIC PANEL WITH GFR
ALT: 28 U/L (ref 0–53)
AST: 17 U/L (ref 0–37)
Albumin: 4.6 g/dL (ref 3.5–5.2)
Alkaline Phosphatase: 81 U/L (ref 39–117)
BUN: 10 mg/dL (ref 6–23)
CO2: 32 meq/L (ref 19–32)
Calcium: 9.7 mg/dL (ref 8.4–10.5)
Chloride: 101 meq/L (ref 96–112)
Creatinine, Ser: 0.74 mg/dL (ref 0.40–1.50)
GFR: 119.39 mL/min
Glucose, Bld: 91 mg/dL (ref 70–99)
Potassium: 4.7 meq/L (ref 3.5–5.1)
Sodium: 141 meq/L (ref 135–145)
Total Bilirubin: 0.5 mg/dL (ref 0.2–1.2)
Total Protein: 7.2 g/dL (ref 6.0–8.3)

## 2021-10-18 LAB — CBC WITH DIFFERENTIAL/PLATELET
Basophils Absolute: 0.1 10*3/uL (ref 0.0–0.1)
Basophils Relative: 1.3 % (ref 0.0–3.0)
Eosinophils Absolute: 0.1 10*3/uL (ref 0.0–0.7)
Eosinophils Relative: 1.6 % (ref 0.0–5.0)
HCT: 42.5 % (ref 39.0–52.0)
Hemoglobin: 14.3 g/dL (ref 13.0–17.0)
Lymphocytes Relative: 28.9 % (ref 12.0–46.0)
Lymphs Abs: 2.2 10*3/uL (ref 0.7–4.0)
MCHC: 33.7 g/dL (ref 30.0–36.0)
MCV: 89.2 fl (ref 78.0–100.0)
Monocytes Absolute: 0.7 10*3/uL (ref 0.1–1.0)
Monocytes Relative: 9 % (ref 3.0–12.0)
Neutro Abs: 4.5 10*3/uL (ref 1.4–7.7)
Neutrophils Relative %: 59.2 % (ref 43.0–77.0)
Platelets: 305 10*3/uL (ref 150.0–400.0)
RBC: 4.77 Mil/uL (ref 4.22–5.81)
RDW: 13.2 % (ref 11.5–15.5)
WBC: 7.7 10*3/uL (ref 4.0–10.5)

## 2021-10-18 LAB — TSH: TSH: 1.08 u[IU]/mL (ref 0.35–5.50)

## 2021-12-17 ENCOUNTER — Telehealth: Payer: Self-pay | Admitting: Internal Medicine

## 2021-12-17 NOTE — Telephone Encounter (Signed)
Form completed, however there is not a fax number on the form to send it to. Mychart message sent to Pt to see if he wants to pick it up or for me to email it.

## 2021-12-17 NOTE — Telephone Encounter (Signed)
Pt dropped off wellness screening form for work. Pt stated he had a check up about two months ago and forgot to have it done during the appt. Form was placed in PCP box.

## 2021-12-20 NOTE — Telephone Encounter (Signed)
Form emailed to Pt as requested and sent for scanning

## 2022-01-12 ENCOUNTER — Ambulatory Visit: Payer: PRIVATE HEALTH INSURANCE | Admitting: Internal Medicine

## 2022-01-14 ENCOUNTER — Encounter: Payer: Self-pay | Admitting: Internal Medicine

## 2022-01-14 ENCOUNTER — Ambulatory Visit: Payer: PRIVATE HEALTH INSURANCE | Admitting: Internal Medicine

## 2022-01-14 VITALS — BP 126/78 | HR 95 | Temp 98.7°F | Resp 16 | Ht 68.0 in | Wt 249.2 lb

## 2022-01-14 DIAGNOSIS — F411 Generalized anxiety disorder: Secondary | ICD-10-CM

## 2022-01-14 NOTE — Patient Instructions (Signed)
Return in 4 months

## 2022-01-14 NOTE — Progress Notes (Unsigned)
   Subjective:    Patient ID: Nicolas Doyle, male    DOB: 07-Mar-1991, 31 y.o.   MRN: [id number]  DOS:  01/14/2022 Type of visit - description: f/u  Follow-up from previous visit, to discuss anxiety, depression and possible medications. Since the last office visit he continue working with his therapist. Reports that he has actually improved significantly. He has become more active, has lost some weight. Still has occasional symptoms that he believes are related to anxiety: Some disequilibrium if he gets up very fast, also episode of chest pain, located near the epigastric area, last 1 of 2 seconds, they are random. He thinks are "diaphragmatic spasms". The episodes typically trigger a bout of anxiety but fortunately they are less frequent now.  Review of Systems See above   Past Medical History:  Diagnosis Date   Pelvic floor dysfunction    Wears glasses     Past Surgical History:  Procedure Laterality Date   VASECTOMY  2017    Current Outpatient Medications  Medication Instructions   Multiple Vitamins-Minerals (MULTIVITAMIN WITH MINERALS) tablet 1 tablet, Daily       Objective:   Physical Exam BP 126/78   Pulse 95   Temp 98.7 F (37.1 C) (Oral)   Resp 16   Ht 5\' 8"  (1.727 m)   Wt 249 lb 4 oz (113.1 kg)   SpO2 95%   BMI 37.90 kg/m  General:   Well developed, NAD, BMI noted. HEENT:  Normocephalic . Face symmetric, atraumatic  Skin: Not pale. Not jaundice Neurologic:  alert & oriented X3.  Speech normal, gait appropriate for age and unassisted Psych--  Cognition and judgment appear intact.  Cooperative with normal attention span and concentration.  Behavior appropriate. Still somewhat anxious but compared to previous visit he seems definitely improved.  No depressed appearing.    Assessment     ASSESSMENT (new patient 09-2019) Generalized anxiety disorder , sees a counselor  Vasectomy 2017   PLAN Generalized anxiety disorder: See LOV, has a  long history of anxiety, still has some somatic symptoms including disequilibrium and chest pain as described above. In the last few months he has work diligently with his psychotherapist he feels he has made a lot of progress. Somatic symptoms have decreased, when he gets anxious his rebound time is quicker. We had a long discussion about medications, explained his options would be SSRIs and possibly BuSpar. He is a still quite "terrified" about medications, side effects of SSRIs include weight gain, sexual side effects, suicidality d/w pt. BuSpar has perhaps is a better fit for him. At the end we agreed that he will think about meds, come back in 4 months, in between if he decides to proceed with medication he will let me know, I will start a low-dose of BuSpar and titrate up. RTC 4 months.  Time spent 30 minutes, extensive listening therapy

## 2022-01-15 NOTE — Assessment & Plan Note (Signed)
Generalized anxiety disorder: See LOV, has a long history of anxiety, still has some somatic symptoms including disequilibrium and chest pain as described above. In the last few months he has work diligently with his psychotherapist he feels he has made a lot of progress. Somatic symptoms have decreased, when he gets anxious his rebound time is quicker. We had a long discussion about medications, explained his options would be SSRIs and possibly BuSpar. He is a still quite "terrified" about medications, side effects of SSRIs include weight gain, sexual side effects, suicidality d/w pt. BuSpar has perhaps is a better fit for him. At the end we agreed that he will think about meds, come back in 4 months, in between if he decides to proceed with medication he will let me know, I will start a low-dose of BuSpar and titrate up. RTC 4 months.

## 2022-03-08 ENCOUNTER — Encounter: Payer: Self-pay | Admitting: Internal Medicine

## 2022-03-09 ENCOUNTER — Ambulatory Visit: Payer: PRIVATE HEALTH INSURANCE | Admitting: Family Medicine

## 2022-03-09 ENCOUNTER — Encounter: Payer: Self-pay | Admitting: Family Medicine

## 2022-03-09 VITALS — BP 118/76 | HR 86 | Temp 98.6°F | Ht 68.0 in | Wt 252.4 lb

## 2022-03-09 DIAGNOSIS — R591 Generalized enlarged lymph nodes: Secondary | ICD-10-CM | POA: Diagnosis not present

## 2022-03-09 NOTE — Patient Instructions (Addendum)
Your blood work is reassuring.   Things to look out for: fevers, night sweats, unintentional weight loss, lumps in chest, changes with the nodes.   Let us know if you need anything.

## 2022-03-09 NOTE — Progress Notes (Signed)
Chief Complaint  Patient presents with   Lymph node under right arm enlarged/painful    Nicolas Doyle is a 31 y.o. male here for a skin complaint.  Duration: 2 years, worse 4 mo ago Location: R armpit Pruritic? No Painful? No Drainage? No New soaps/lotions/topicals/detergents? No Seems to happen after covid vaccine. Most recent one was 4 mo ago.  Other associated symptoms: no fevers, not getting bigger, no wt loss, night sweats Therapies tried thus far: none  Past Medical History:  Diagnosis Date   Pelvic floor dysfunction    Wears glasses     BP 118/76 (BP Location: Left Arm, Patient Position: Sitting, Cuff Size: Normal)   Pulse 86   Temp 98.6 F (37 C) (Oral)   Ht 5\' 8"  (1.727 m)   Wt 252 lb 6 oz (114.5 kg)   SpO2 96%   BMI 38.37 kg/m  Gen: awake, alert, appearing stated age Lungs: No accessory muscle use Skin: 0.8 x 0.6 cm LN in R axillae, 0.4 x 0.4 and 0.5 x 0.4 cm LN's more distal. No drainage, erythema, TTP, fluctuance, excoriation I did not appreciate any supraclav or cerv adenopathy Psych: Age appropriate judgment and insight  Lymphadenopathy  Reassurance. Tylenol prn. Reviewed lab work. No red flag s/s's. Discussed s/s's to look out for.  F/u prn. The patient voiced understanding and agreement to the plan.  I spent 22 minutes with the patient discussing the above plan in addition to reviewing their chart information on the same day of the visit.   [place], DO 03/09/22 3:44 PM

## 2022-05-18 ENCOUNTER — Ambulatory Visit: Payer: PRIVATE HEALTH INSURANCE | Admitting: Internal Medicine

## 2022-06-14 IMAGING — DX DG WRIST COMPLETE 3+V*R*
4 series · 4 of 4 positions shown · non-contrast
Comparison: None.

CLINICAL DATA: Wrist pain

EXAM:
RIGHT WRIST - COMPLETE 3+ VIEW

[wrist pa]
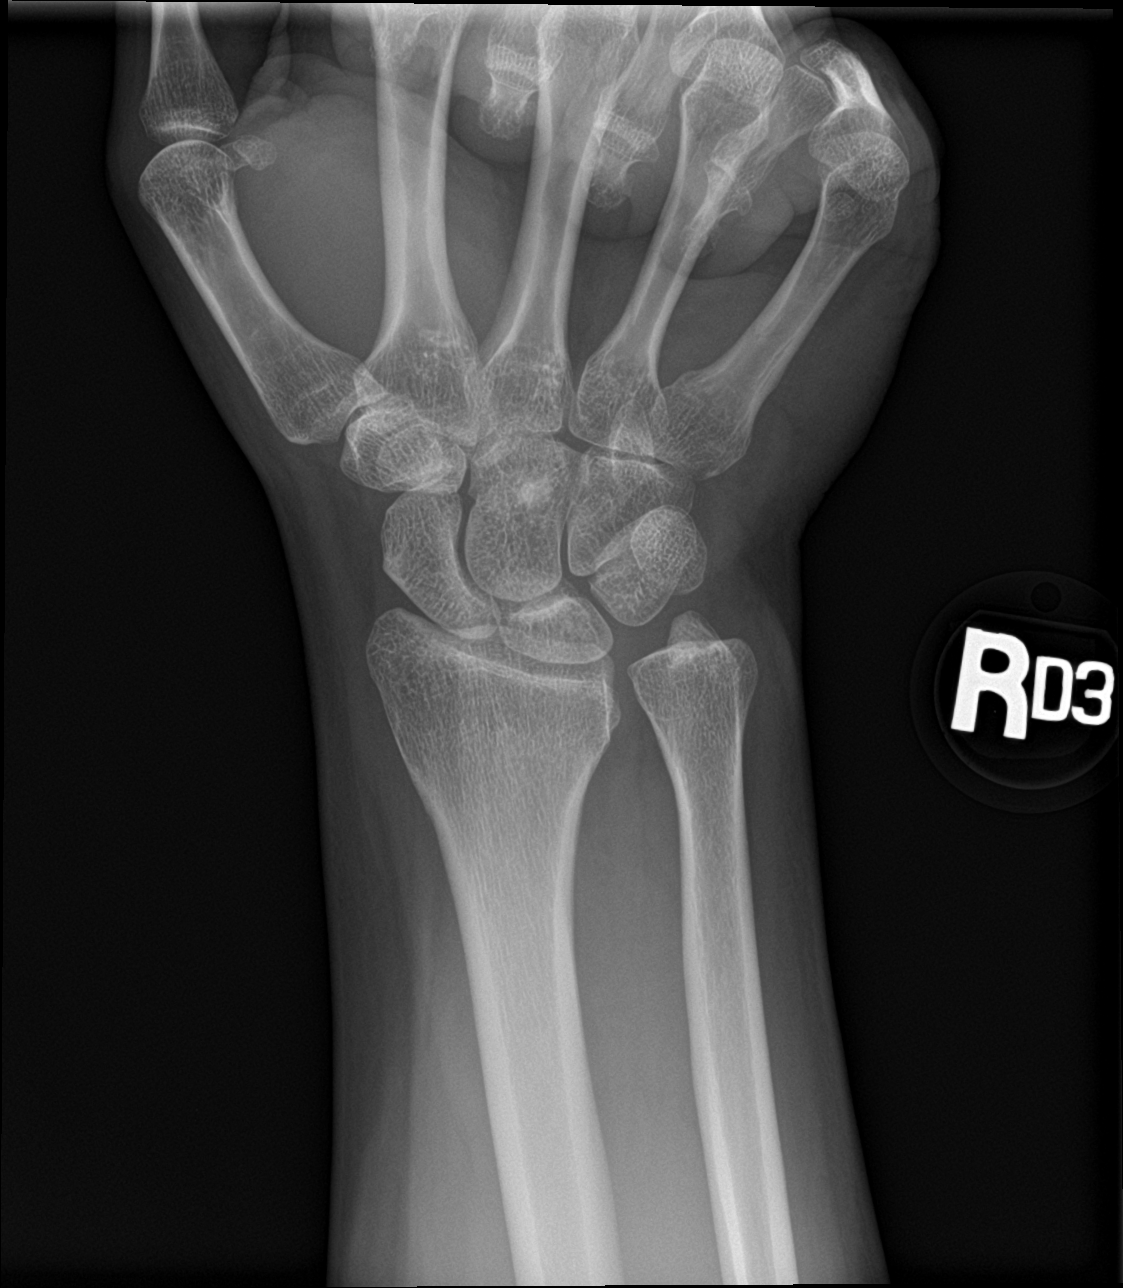

[wrist obl]
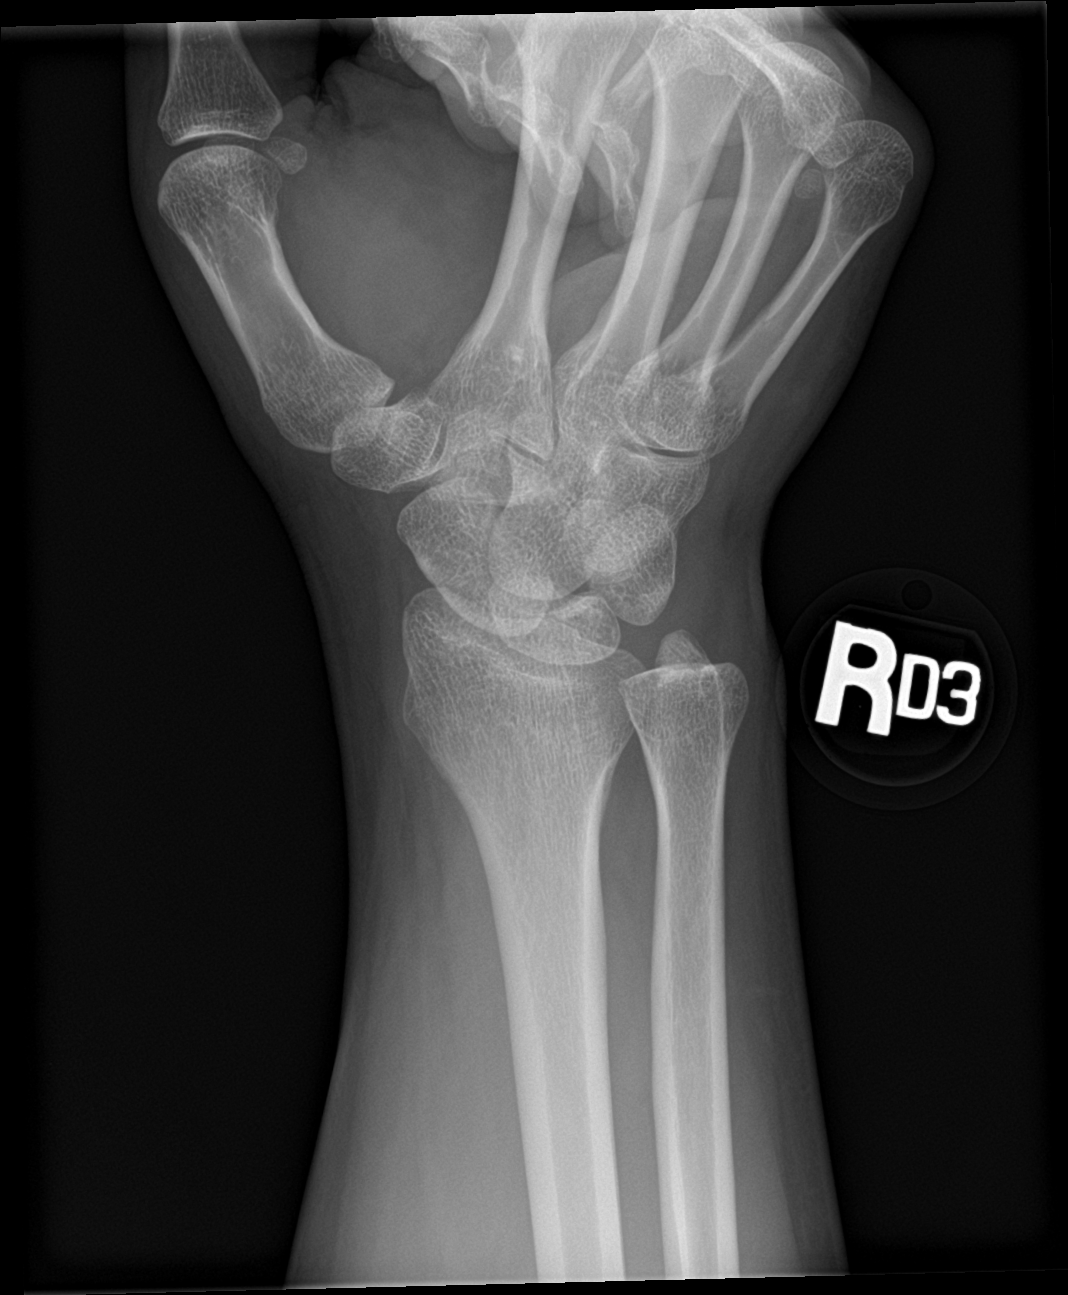

[wrist lat]
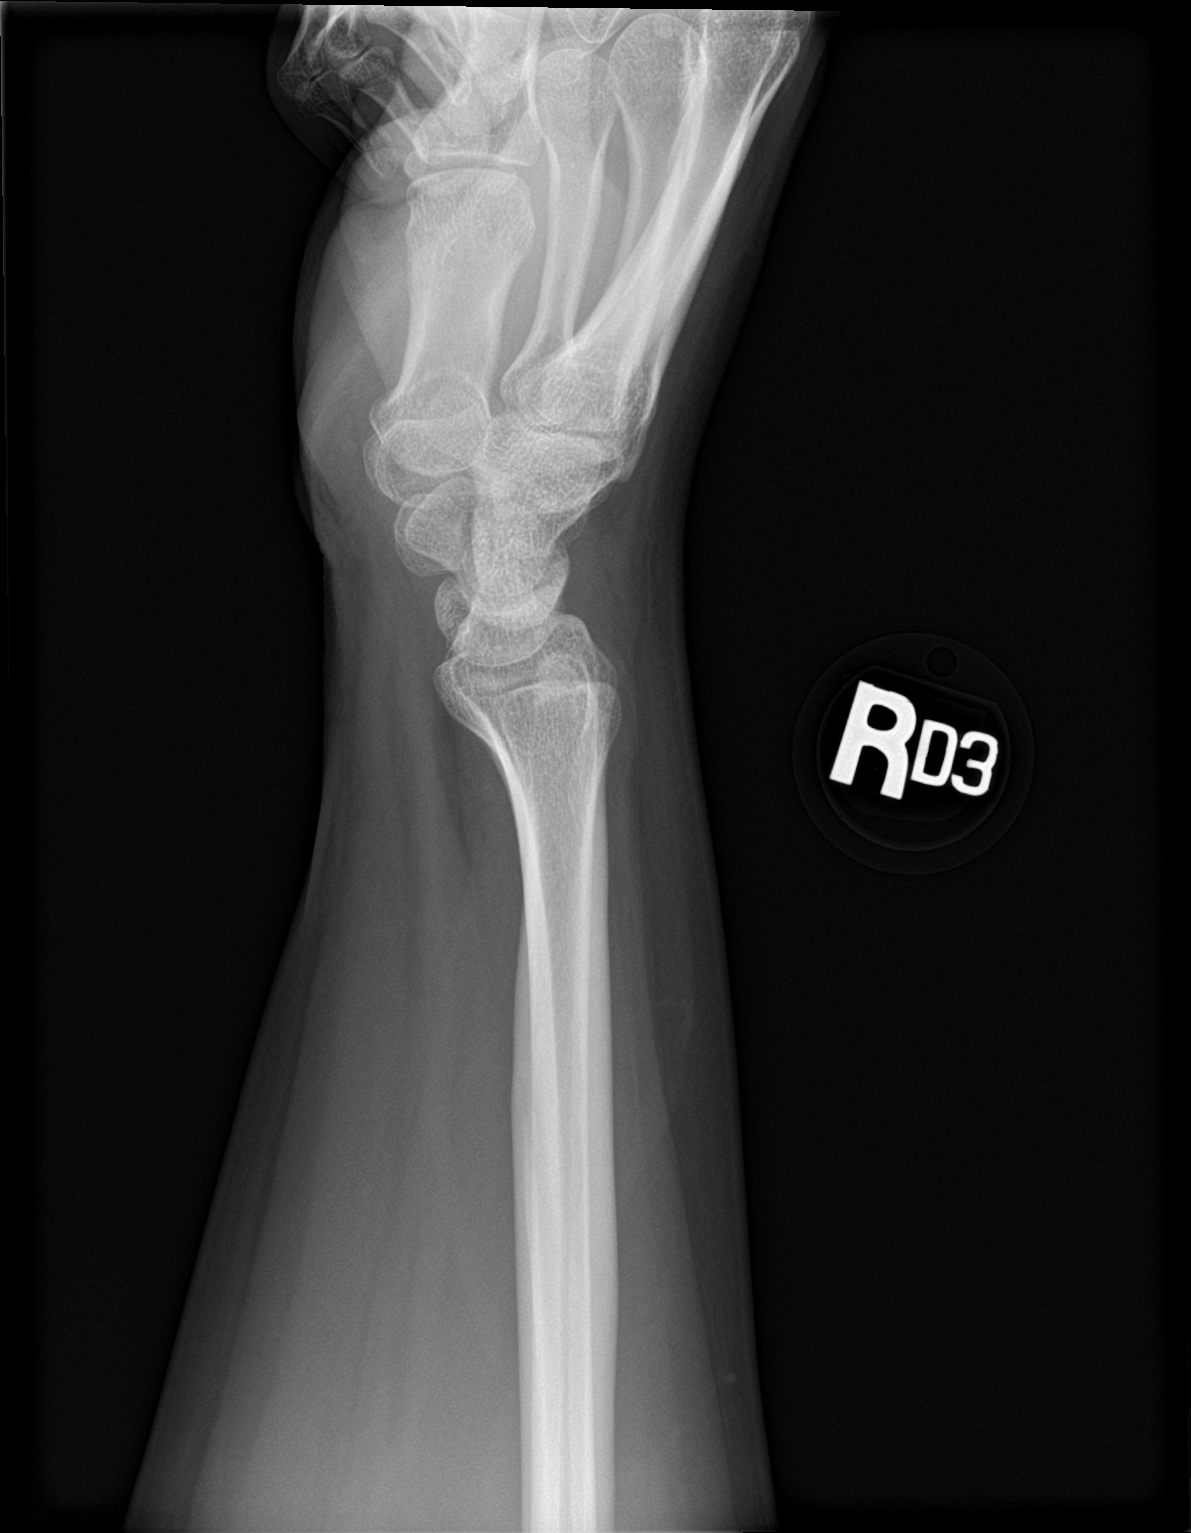

[wrist navicular]
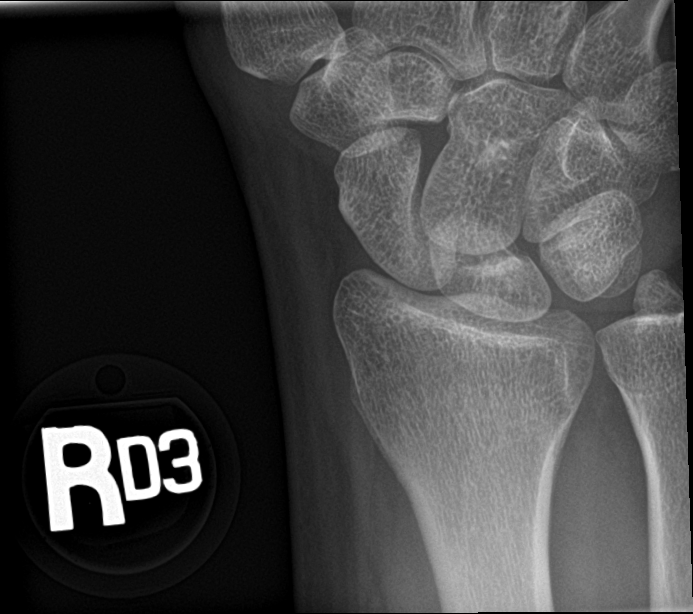

[4 of 4 positions shown; findings below may reference images not displayed]

FINDINGS: There is no evidence of fracture or dislocation. There is no
evidence of arthropathy or other focal bone abnormality. Soft
tissues are unremarkable.
IMPRESSION: Negative.

## 2022-10-17 ENCOUNTER — Telehealth: Payer: Self-pay | Admitting: Internal Medicine

## 2022-10-17 NOTE — Telephone Encounter (Signed)
Error

## 2022-10-19 ENCOUNTER — Encounter: Payer: Self-pay | Admitting: Internal Medicine

## 2022-10-19 ENCOUNTER — Ambulatory Visit (INDEPENDENT_AMBULATORY_CARE_PROVIDER_SITE_OTHER): Payer: PRIVATE HEALTH INSURANCE | Admitting: Internal Medicine

## 2022-10-19 VITALS — BP 118/68 | HR 82 | Temp 98.9°F | Resp 16 | Ht 68.0 in | Wt 248.1 lb

## 2022-10-19 DIAGNOSIS — Z0000 Encounter for general adult medical examination without abnormal findings: Secondary | ICD-10-CM

## 2022-10-19 LAB — LIPID PANEL
Cholesterol: 195 mg/dL (ref 0–200)
HDL: 47.8 mg/dL (ref >39.00)
LDL Cholesterol: 118 mg/dL — ABNORMAL HIGH (ref 0–99)
NonHDL: 147.11
Total CHOL/HDL Ratio: 4
Triglycerides: 145 mg/dL (ref 0.0–149.0)
VLDL: 29 mg/dL (ref 0.0–40.0)

## 2022-10-19 LAB — CBC WITH DIFFERENTIAL/PLATELET
Basophils Absolute: 0.1 10*3/uL (ref 0.0–0.1)
Basophils Relative: 0.8 % (ref 0.0–3.0)
Eosinophils Absolute: 0.1 10*3/uL (ref 0.0–0.7)
Eosinophils Relative: 1.6 % (ref 0.0–5.0)
HCT: 44 % (ref 39.0–52.0)
Hemoglobin: 14.4 g/dL (ref 13.0–17.0)
Lymphocytes Relative: 29.7 % (ref 12.0–46.0)
Lymphs Abs: 2.3 10*3/uL (ref 0.7–4.0)
MCHC: 32.8 g/dL (ref 30.0–36.0)
MCV: 90.3 fl (ref 78.0–100.0)
Monocytes Absolute: 0.5 10*3/uL (ref 0.1–1.0)
Monocytes Relative: 6.9 % (ref 3.0–12.0)
Neutro Abs: 4.7 10*3/uL (ref 1.4–7.7)
Neutrophils Relative %: 61 % (ref 43.0–77.0)
Platelets: 325 10*3/uL (ref 150.0–400.0)
RBC: 4.87 Mil/uL (ref 4.22–5.81)
RDW: 13.1 % (ref 11.5–15.5)
WBC: 7.7 10*3/uL (ref 4.0–10.5)

## 2022-10-19 LAB — BASIC METABOLIC PANEL
BUN: 8 mg/dL (ref 6–23)
CO2: 28 mEq/L (ref 19–32)
Calcium: 9.6 mg/dL (ref 8.4–10.5)
Chloride: 101 mEq/L (ref 96–112)
Creatinine, Ser: 0.74 mg/dL (ref 0.40–1.50)
GFR: 118.55 mL/min (ref >60.00)
Glucose, Bld: 85 mg/dL (ref 70–99)
Potassium: 3.8 mEq/L (ref 3.5–5.1)
Sodium: 140 mEq/L (ref 135–145)

## 2022-10-19 NOTE — Patient Instructions (Addendum)
Vaccines I recommend: Covid booster if recommended by the CDC Flu shot this fall  Continue watching your diet closely Try to exercise a total of 3 hours a week.  Check the  blood pressure regularly BP GOAL is between 110/65 and  135/85. If it is consistently higher or lower, let me know  GO TO THE LAB : Get the blood work     GO TO THE FRONT DESK, PLEASE SCHEDULE YOUR APPOINTMENTS Come back for physical exam in 1 year

## 2022-10-19 NOTE — Progress Notes (Signed)
   Subjective:    Patient ID: Nicolas Doyle, male    DOB: September 05, 1990, 32 y.o.   MRN: [id number]  DOS:  10/19/2022 Type of visit - description: cpx  Here for CPX. Doing well. No new concerns.  Occasionally has a "chest spasm" but has been better lately  Review of Systems  Other than above, a 14 point review of systems is negative     Past Medical History:  Diagnosis Date   Pelvic floor dysfunction    Wears glasses     Past Surgical History:  Procedure Laterality Date   VASECTOMY  2017   Social History   Socioeconomic History   Marital status: Married    Spouse name: Not on file   Number of children: 0   Years of education: Not on file   Highest education level: Not on file  Occupational History   Occupation: finished college   Occupation: Sport and exercise psychologist , works from home  Tobacco Use   Smoking status: Never   Smokeless tobacco: Never  Substance and Sexual Activity   Alcohol use: Yes    Comment: rarely   Drug use: No   Sexual activity: Not on file  Other Topics Concern   Not on file  Social History Narrative   Married   Social Determinants of Health   Financial Resource Strain: Not on file  Food Insecurity: Not on file  Transportation Needs: Not on file  Physical Activity: Not on file  Stress: Not on file  Social Connections: Unknown (07/27/2021)   Received from [facility]   Social Network    Social Network: Not on file  Intimate Partner Violence: Unknown (06/22/2021)   Received from [facility]   HITS    Physically Hurt: Not on file    Insult or Talk Down To: Not on file    Threaten Physical Harm: Not on file    Scream or Curse: Not on file    Current Outpatient Medications  Medication Instructions   Multiple Vitamins-Minerals (MULTIVITAMIN WITH MINERALS) tablet 1 tablet, Oral, Daily       Objective:   Physical Exam BP 118/68   Pulse 82   Temp 98.9 F (37.2 C) (Oral)   Resp 16   Ht 5\' 8"  (1.727 m)   Wt 248 lb 2 oz (112.5  kg)   SpO2 98%   BMI 37.73 kg/m  General: Well developed, NAD, BMI noted Neck: No  thyromegaly  HEENT:  Normocephalic . Face symmetric, atraumatic Lungs:  CTA B Normal respiratory effort, no intercostal retractions, no accessory muscle use. Heart: RRR,  no murmur.  Abdomen:  Not distended, soft, non-tender. No rebound or rigidity.   Lower extremities: no pretibial edema bilaterally  Skin: Exposed areas without rash. Not pale. Not jaundice Neurologic:  alert & oriented X3.  Speech normal, gait appropriate for age and unassisted Strength symmetric and appropriate for age.  Psych: Cognition and judgment appear intact.  Cooperative with normal attention span and concentration.  Behavior appropriate. No anxious or depressed appearing.     Assessment     ASSESSMENT (new patient 09-2019) Generalized anxiety disorder , sees a counselor  Vasectomy 2017   PLAN For CPX Tdap: 2021 COVID VAX: Up-to-date.  Watch for future CDC recommendations  Recommend flu shot yearly Labs: BMP FLP CBC Anxiety disorder: Sees a counselor Obesity: BMI 37, we talk about diet and exercise. RTC 1 year

## 2022-10-19 NOTE — Assessment & Plan Note (Signed)
For CPX Tdap: 2021 COVID VAX: Up-to-date.  Watch for future CDC recommendations  Recommend flu shot yearly Labs: BMP FLP CBC

## 2022-10-19 NOTE — Assessment & Plan Note (Signed)
For CPX  Anxiety disorder: Sees a counselor Obesity: BMI 37, we talk about diet and exercise. RTC 1 year

## 2022-10-20 ENCOUNTER — Telehealth: Payer: Self-pay

## 2022-10-20 NOTE — Telephone Encounter (Signed)
Spoke w/ Pt- informed that form is ready for pick up at front desk at his convenience. Copy of form sent for scanning.

## 2023-10-25 ENCOUNTER — Ambulatory Visit (INDEPENDENT_AMBULATORY_CARE_PROVIDER_SITE_OTHER): Payer: PRIVATE HEALTH INSURANCE | Admitting: Internal Medicine

## 2023-10-25 ENCOUNTER — Encounter: Payer: Self-pay | Admitting: Internal Medicine

## 2023-10-25 VITALS — BP 116/70 | HR 91 | Temp 98.1°F | Resp 16 | Ht 68.0 in | Wt 243.2 lb

## 2023-10-25 DIAGNOSIS — Z0000 Encounter for general adult medical examination without abnormal findings: Secondary | ICD-10-CM

## 2023-10-25 DIAGNOSIS — R41840 Attention and concentration deficit: Secondary | ICD-10-CM | POA: Diagnosis not present

## 2023-10-25 LAB — COMPREHENSIVE METABOLIC PANEL WITH GFR
ALT: 26 U/L (ref 0–53)
AST: 16 U/L (ref 0–37)
Albumin: 4.6 g/dL (ref 3.5–5.2)
Alkaline Phosphatase: 83 U/L (ref 39–117)
BUN: 12 mg/dL (ref 6–23)
CO2: 32 meq/L (ref 19–32)
Calcium: 9.8 mg/dL (ref 8.4–10.5)
Chloride: 102 meq/L (ref 96–112)
Creatinine, Ser: 0.69 mg/dL (ref 0.40–1.50)
GFR: 120.22 mL/min (ref >60.00)
Glucose, Bld: 94 mg/dL (ref 70–99)
Potassium: 4.3 meq/L (ref 3.5–5.1)
Sodium: 141 meq/L (ref 135–145)
Total Bilirubin: 0.5 mg/dL (ref 0.2–1.2)
Total Protein: 7.3 g/dL (ref 6.0–8.3)

## 2023-10-25 LAB — LIPID PANEL
Cholesterol: 174 mg/dL (ref 0–200)
HDL: 47.8 mg/dL (ref >39.00)
LDL Cholesterol: 102 mg/dL — ABNORMAL HIGH (ref 0–99)
NonHDL: 126.37
Total CHOL/HDL Ratio: 4
Triglycerides: 124 mg/dL (ref 0.0–149.0)
VLDL: 24.8 mg/dL (ref 0.0–40.0)

## 2023-10-25 LAB — CBC WITH DIFFERENTIAL/PLATELET
Basophils Absolute: 0.1 K/uL (ref 0.0–0.1)
Basophils Relative: 0.8 % (ref 0.0–3.0)
Eosinophils Absolute: 0.1 K/uL (ref 0.0–0.7)
Eosinophils Relative: 2.3 % (ref 0.0–5.0)
HCT: 44.5 % (ref 39.0–52.0)
Hemoglobin: 15 g/dL (ref 13.0–17.0)
Lymphocytes Relative: 28.3 % (ref 12.0–46.0)
Lymphs Abs: 1.8 K/uL (ref 0.7–4.0)
MCHC: 33.7 g/dL (ref 30.0–36.0)
MCV: 89.1 fl (ref 78.0–100.0)
Monocytes Absolute: 0.5 K/uL (ref 0.1–1.0)
Monocytes Relative: 7.1 % (ref 3.0–12.0)
Neutro Abs: 4 K/uL (ref 1.4–7.7)
Neutrophils Relative %: 61.5 % (ref 43.0–77.0)
Platelets: 321 K/uL (ref 150.0–400.0)
RBC: 5 Mil/uL (ref 4.22–5.81)
RDW: 13.5 % (ref 11.5–15.5)
WBC: 6.5 K/uL (ref 4.0–10.5)

## 2023-10-25 NOTE — Assessment & Plan Note (Signed)
 Here for CPX   Other issues: Anxiety disorder: Has not seen a counselor but feels well. ADHD?  Wonders about this diagnosis, rec to see one of our counselors for screening. Obesity: BMI 36, doing great, has increased his exercise, eating healthier, has noticed his muscle mass to be increased.  Has not lost weight but again he feels great. Lump, R axillary area: See physical exam, benign. RTC 1 year

## 2023-10-25 NOTE — Assessment & Plan Note (Signed)
 Here for CPX Tdap: 2021 HPV is an option, he is inclined to proceed, recommend to check with his insurance to be sure the vaccine is covered.. Last COVID-vaccine September 2024 Recommend flu shot yearly and a COVID-vaccine is an option. Labs: CMP FLP CBC Diet exercise: Improved significantly, praised!

## 2023-10-25 NOTE — Patient Instructions (Addendum)
  Continue with your healthy lifestyle.  HPV vaccine is a good option.  Check with your insurance to see if they cover the HPV vaccine: CPT code 09350 Then you can get your vaccines either here or at a pharmacy  [facility] reach out to [facility] for ADHD screening.  GO TO THE LAB :  Get the blood work   Your results will be posted on MyChart with my comments  Next office visit for a physical exam in 1 year Please make an appointment before you leave today

## 2023-10-25 NOTE — Progress Notes (Signed)
   Subjective:    Patient ID: Nicolas Doyle, male    DOB: March 10, 1991, 33 y.o.   MRN: [id number]  DOS:  10/25/2023 Type of visit - description: CPX  Here for CPX. Since the last visit he has changed his lifestyle and feeling great. Anxiety is under better control but he wonders about ADHD. He is concentration has decreased and he bounces around frequently.  Not causing trouble at work. No depression. Has a lump on the right axillary, no change in years but would like me to check  Review of Systems  Other than above, a 14 point review of systems is negative       Past Medical History:  Diagnosis Date   Pelvic floor dysfunction    Wears glasses     Past Surgical History:  Procedure Laterality Date   VASECTOMY  2017   Social History   Social History Narrative   Married    Current Outpatient Medications  Medication Instructions   Multiple Vitamins-Minerals (MULTIVITAMIN WITH MINERALS) tablet 1 tablet, Daily       Objective:   Physical Exam BP 116/70   Pulse 91   Temp 98.1 F (36.7 C) (Oral)   Resp 16   Ht 5' 8 (1.727 m)   Wt 243 lb 4 oz (110.3 kg)   SpO2 97%   BMI 36.99 kg/m  General: Well developed, NAD, BMI noted Neck: No  thyromegaly  HEENT:  Normocephalic . Face symmetric, atraumatic Axillary areas: On simple inspection, the right side seems to be slightly  bulge, upon palpation I do not feel any lymphadenopathies, mass, he simply seems to have more fatty tissue there. Lungs:  CTA B Normal respiratory effort, no intercostal retractions, no accessory muscle use. Heart: RRR,  no murmur.  Abdomen:  Not distended, soft, non-tender. No rebound or rigidity.   Lower extremities: no pretibial edema bilaterally  Skin: Exposed areas without rash. Not pale. Not jaundice Neurologic:  alert & oriented X3.  Speech normal, gait appropriate for age and unassisted Strength symmetric and appropriate for age.  Psych: Cognition and judgment appear  intact.  Cooperative with normal attention span and concentration.  Behavior appropriate. No anxious or depressed appearing.     Assessment    ASSESSMENT (new patient 09-2019) Generalized anxiety disorder , sees a counselor  Vasectomy 2017   PLAN Here for CPX Tdap: 2021 HPV is an option, he is inclined to proceed, recommend to check with his insurance to be sure the vaccine is covered.. Last COVID-vaccine September 2024 Recommend flu shot yearly and a COVID-vaccine is an option. Labs: CMP FLP CBC Diet exercise: Improved significantly, praised!  Other issues: Anxiety disorder: Has not seen a counselor but feels well. ADHD?  Wonders about this diagnosis, rec to see one of our counselors for screening. Obesity: BMI 36, doing great, has increased his exercise, eating healthier, has noticed his muscle mass to be increased.  Has not lost weight but again he feels great. Lump, R axillary area: See physical exam, benign. RTC 1 year

## 2023-10-26 ENCOUNTER — Ambulatory Visit: Payer: Self-pay | Admitting: Internal Medicine

## 2023-10-26 ENCOUNTER — Telehealth: Payer: Self-pay

## 2023-10-26 NOTE — Telephone Encounter (Signed)
 LMOM informing Pt that his physical form has been completed and ready for pick up at front desk. Copy sent for scanning.

## 2023-12-04 ENCOUNTER — Telehealth: Payer: Self-pay

## 2023-12-04 MED ORDER — COVID-19 MRNA VAC-TRIS(PFIZER) 30 MCG/0.3ML IM SUSY: 0.3000 mL | PREFILLED_SYRINGE | Freq: Once | INTRAMUSCULAR | 0 refills | Status: AC

## 2023-12-04 NOTE — Telephone Encounter (Signed)
 Pt 33 years old, bmi 36.99, rx sent.

## 2023-12-04 NOTE — Telephone Encounter (Signed)
 Copied from CRM [id number]. Topic: Clinical - Prescription Issue >> Dec 04, 2023  2:04 PM Harlene ORN wrote: Reason for CRM: Patient called. Requesting to get the prescription needed to get the Covid vaccine.

## 2024-04-04 ENCOUNTER — Telehealth: Admitting: Student

## 2024-04-04 DIAGNOSIS — U071 COVID-19: Secondary | ICD-10-CM

## 2024-04-04 DIAGNOSIS — R051 Acute cough: Secondary | ICD-10-CM

## 2024-04-04 MED ORDER — BENZONATATE 100 MG PO CAPS: 100.0000 mg | ORAL_CAPSULE | Freq: Three times a day (TID) | ORAL | 0 refills | Status: AC | PRN

## 2024-04-04 NOTE — Progress Notes (Signed)
 "   MyChart Video Visit    Virtual Visit via Video Note    Patient location: Home. Patient and provider in visit Provider location: Office  I discussed the limitations of evaluation and management by telemedicine and the availability of in person appointments. The patient expressed understanding and agreed to proceed.  Visit Date: 04/04/2024  Today's healthcare provider: Harlene LITTIE Jolly, NP     Subjective:    Patient ID: Nicolas Doyle, male    DOB: 1990/06/10, 35 y.o.   MRN: [id number]  No chief complaint on file.   HPI   History of Present Illness Nicolas Doyle is a 34 year old male who presents with symptoms following a positive COVID-19 test.  He tested positive for COVID-19 this morning after his wife tested positive yesterday, both following recent travel to a convention. Last night he had chills, vivid dreams, and woke up sweaty once or twice. This morning he has a constant diffuse headache that feels like whole-head congestion or pounding, along with cough, nasal and head congestion, fatigue, scratchy throat, and body aches.   Patient denies fever SOB, CP, palpitations, dyspnea, edema, HA, vision changes, N/V/D, abdominal pain, urinary symptoms, rash.   Past Medical History:  Diagnosis Date   Pelvic floor dysfunction    Wears glasses     Past Surgical History:  Procedure Laterality Date   VASECTOMY  2017    Family History  Problem Relation Age of Onset   Diabetes Maternal Grandfather    Diabetes Paternal Grandmother    Colon cancer Neg Hx    Prostate cancer Neg Hx    CAD Neg Hx     Social History   Socioeconomic History   Marital status: Married    Spouse name: Not on file   Number of children: 0   Years of education: Not on file   Highest education level: Bachelor's degree (e.g., BA, AB, BS)  Occupational History   Occupation: finished college   Occupation: sport and exercise psychologist , works from home  Tobacco Use    Smoking status: Never   Smokeless tobacco: Never  Substance and Sexual Activity   Alcohol use: Yes    Comment: rarely   Drug use: No   Sexual activity: Not on file  Other Topics Concern   Not on file  Social History Narrative   Married   Social Drivers of Health   Tobacco Use: Low Risk (10/25/2023)   Patient History    Smoking Tobacco Use: Never    Smokeless Tobacco Use: Never    Passive Exposure: Not on file  Financial Resource Strain: Low Risk (04/04/2024)   Overall Financial Resource Strain (CARDIA)    Difficulty of Paying Living Expenses: Not hard at all  Food Insecurity: No Food Insecurity (04/04/2024)   Epic    Worried About Programme Researcher, Broadcasting/film/video in the Last Year: Never true    Ran Out of Food in the Last Year: Never true  Transportation Needs: No Transportation Needs (04/04/2024)   Epic    Lack of Transportation (Medical): No    Lack of Transportation (Non-Medical): No  Physical Activity: Insufficiently Active (04/04/2024)   Exercise Vital Sign    Days of Exercise per Week: 2 days    Minutes of Exercise per Session: 60 min  Stress: No Stress Concern Present (04/04/2024)   [employer] of Occupational [employer] - Occupational Stress Questionnaire    Feeling of Stress: Only a little  Social Connections: Moderately Integrated (04/04/2024)  Social Advertising Account Executive    Frequency of Communication with Friends and Family: More than three times a week    Frequency of Social Gatherings with Friends and Family: Twice a week    Attends Religious Services: Patient declined    Database Administrator or Organizations: Yes    Attends Banker Meetings: 1 to 4 times per year    Marital Status: Married  Catering Manager Violence: Unknown (06/22/2021)   Received from [facility]   HITS    Physically Hurt: Not on file    Insult or Talk Down To: Not on file    Threaten Physical Harm: Not on file    Scream or Curse: Not on file  Depression (PHQ2-9): Low Risk  (10/25/2023)   Depression (PHQ2-9)    PHQ-2 Score: 0  Alcohol Screen: Low Risk (04/04/2024)   Alcohol Screen    Last Alcohol Screening Score (AUDIT): 1  Housing: Unknown (04/04/2024)   Epic    Unable to Pay for Housing in the Last Year: No    Number of Times Moved in the Last Year: Not on file    Homeless in the Last Year: No  Utilities: Not on file  Health Literacy: Not on file    Outpatient Medications Prior to Visit  Medication Sig Dispense Refill   Multiple Vitamins-Minerals (MULTIVITAMIN WITH MINERALS) tablet Take 1 tablet by mouth daily.     No facility-administered medications prior to visit.    Allergies[1]  ROS See HPI    Objective:    Physical Exam General: Patient appears well-nourished and in no acute distress. Alert and oriented to person and place. Engaged with provider throughout visit  HEENT: Head atraumatic and normocephalic. No facial asymmetry.. Pupils appear equal and reactive.  Respiratory: Breathing unlabored. No cough, wheezing, or respiratory distress observed. Respiratory rate appears normal. Neurological: Patient alert and oriented. Speech coherent. No facial droop, slurred speech, or focal deficits observed. Psychiatric: Mood and affect assessed via video. Patient cooperative. Thought process logical/coherent   There were no vitals taken for this visit. Wt Readings from Last 3 Encounters:  10/25/23 243 lb 4 oz (110.3 kg)  10/19/22 248 lb 2 oz (112.5 kg)  03/09/22 252 lb 6 oz (114.5 kg)       Assessment & Plan:   Problem List Items Addressed This Visit       Other   COVID-19 - Primary   Other Visit Diagnoses       Acute cough       Relevant Medications   benzonatate  (TESSALON ) 100 MG capsule       Acute COVID-19 infection with typical symptoms.  - Manage symptoms with Flonase, decongestants, and Mucinex as needed. - Encourage increased fluid intake, at least 64oz daily - May use OTC Tylenol 500 mg every 4-6 hours for fever and  body aches - Rx-Tessalon  Perles for cough, up to three times a day as needed. - Consider NyQuil or DayQuil for additional symptom relief. - Use honey, tea, lozenges, and chloraseptic spray for throat discomfort. - Notify clinic if symptoms persist beyond 10 days or worsen after initial improvement. - Advised wearing a mask, covering mouth when coughing, and washing hands to prevent transmission.   I am having Nicolas ORN. Mabus Chris start on benzonatate . I am also having him maintain his multivitamin with minerals.  Meds ordered this encounter  Medications   benzonatate  (TESSALON ) 100 MG capsule    Sig: Take 1 capsule (100 mg total)  by mouth 3 (three) times daily as needed.    Dispense:  30 capsule    Refill:  0    Supervising Provider:   DOMENICA BLACKBIRD A [[id number]]    I discussed the assessment and treatment plan with the patient. The patient was provided an opportunity to ask questions and all were answered. The patient agreed with the plan and demonstrated an understanding of the instructions.   The patient was advised to call back or seek an in-person evaluation if the symptoms worsen or if the condition fails to improve as anticipated.  I provided 13  minutes of face-to-face time during this encounter.   Harlene LITTIE Jolly, NP [facility] [phone] (phone) [phone] (fax)  [facility]      [1] No Known Allergies  "

## 2024-10-30 ENCOUNTER — Encounter: Payer: PRIVATE HEALTH INSURANCE | Admitting: Internal Medicine
